# Patient Record
Sex: Female | Born: 1980 | Race: Black or African American | Hispanic: No | Marital: Single | Smoking: Never smoker
Health system: Southern US, Community
[De-identification: ages and names within clinical notes are randomized; demographics above are authoritative.]

---
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
## 2003-03-15 ENCOUNTER — Encounter: Payer: Self-pay | Admitting: Emergency Medicine

## 2003-03-15 ENCOUNTER — Emergency Department (HOSPITAL_COMMUNITY): Admission: EM | Admit: 2003-03-15 | Discharge: 2003-03-15 | Payer: Self-pay | Admitting: Emergency Medicine

## 2003-05-18 ENCOUNTER — Emergency Department (HOSPITAL_COMMUNITY): Admission: EM | Admit: 2003-05-18 | Discharge: 2003-05-18 | Payer: Self-pay | Admitting: Emergency Medicine

## 2003-05-31 ENCOUNTER — Emergency Department (HOSPITAL_COMMUNITY): Admission: EM | Admit: 2003-05-31 | Discharge: 2003-05-31 | Payer: Self-pay | Admitting: Emergency Medicine

## 2004-05-14 ENCOUNTER — Emergency Department (HOSPITAL_COMMUNITY): Admission: EM | Admit: 2004-05-14 | Discharge: 2004-05-14 | Payer: Self-pay | Admitting: Emergency Medicine

## 2004-10-05 ENCOUNTER — Emergency Department (HOSPITAL_COMMUNITY): Admission: EM | Admit: 2004-10-05 | Discharge: 2004-10-05 | Payer: Self-pay | Admitting: Emergency Medicine

## 2013-07-15 ENCOUNTER — Emergency Department (HOSPITAL_COMMUNITY): Payer: No Typology Code available for payment source

## 2013-07-15 ENCOUNTER — Emergency Department (HOSPITAL_COMMUNITY): Payer: Self-pay

## 2013-07-15 ENCOUNTER — Emergency Department (HOSPITAL_COMMUNITY)
Admission: EM | Admit: 2013-07-15 | Discharge: 2013-07-15 | Disposition: A | Discharge status: Home / Self Care | Payer: Self-pay | Attending: Emergency Medicine | Admitting: Emergency Medicine

## 2013-07-15 ENCOUNTER — Other Ambulatory Visit: Payer: Self-pay

## 2013-07-15 ENCOUNTER — Encounter (HOSPITAL_COMMUNITY): Payer: Self-pay | Admitting: Emergency Medicine

## 2013-07-15 DIAGNOSIS — S3981XA Other specified injuries of abdomen, initial encounter: Secondary | ICD-10-CM | POA: Insufficient documentation

## 2013-07-15 DIAGNOSIS — K146 Glossodynia: Secondary | ICD-10-CM | POA: Insufficient documentation

## 2013-07-15 DIAGNOSIS — Y9389 Activity, other specified: Secondary | ICD-10-CM | POA: Insufficient documentation

## 2013-07-15 DIAGNOSIS — Y92410 Unspecified street and highway as the place of occurrence of the external cause: Secondary | ICD-10-CM | POA: Insufficient documentation

## 2013-07-15 DIAGNOSIS — IMO0002 Reserved for concepts with insufficient information to code with codable children: Secondary | ICD-10-CM | POA: Insufficient documentation

## 2013-07-15 DIAGNOSIS — S0993XA Unspecified injury of face, initial encounter: Secondary | ICD-10-CM | POA: Insufficient documentation

## 2013-07-15 DIAGNOSIS — Z79899 Other long term (current) drug therapy: Secondary | ICD-10-CM | POA: Insufficient documentation

## 2013-07-15 DIAGNOSIS — Z3202 Encounter for pregnancy test, result negative: Secondary | ICD-10-CM | POA: Insufficient documentation

## 2013-07-15 LAB — POCT I-STAT, CHEM 8
BUN: 13 mg/dL (ref 6–23)
Chloride: 101 mEq/L (ref 96–112)
Creatinine, Ser: 0.9 mg/dL (ref 0.50–1.10)
Potassium: 3.7 mEq/L (ref 3.5–5.1)
Sodium: 140 mEq/L (ref 135–145)

## 2013-07-15 LAB — CBC WITH DIFFERENTIAL/PLATELET
Basophils Relative: 0 % (ref 0–1)
Eosinophils Absolute: 0.2 10*3/uL (ref 0.0–0.7)
Hemoglobin: 14.5 g/dL (ref 12.0–15.0)
MCH: 33.9 pg (ref 26.0–34.0)
MCHC: 35.9 g/dL (ref 30.0–36.0)
Monocytes Relative: 8 % (ref 3–12)
Neutrophils Relative %: 58 % (ref 43–77)
Platelets: 229 10*3/uL (ref 150–400)
WBC: 7.7 10*3/uL (ref 4.0–10.5)

## 2013-07-15 LAB — COMPREHENSIVE METABOLIC PANEL
Albumin: 3.8 g/dL (ref 3.5–5.2)
Alkaline Phosphatase: 40 U/L (ref 39–117)
BUN: 13 mg/dL (ref 6–23)
CO2: 25 mEq/L (ref 19–32)
Creatinine, Ser: 0.68 mg/dL (ref 0.50–1.10)
GFR calc non Af Amer: >90 mL/min (ref >90)
Glucose, Bld: 86 mg/dL (ref 70–99)
Potassium: 3.7 mEq/L (ref 3.5–5.1)
Total Protein: 7.8 g/dL (ref 6.0–8.3)

## 2013-07-15 MED ORDER — ONDANSETRON HCL 4 MG/2ML IJ SOLN
4.0000 mg | Freq: Once | INTRAMUSCULAR | Status: AC
  Administered 2013-07-15: 4 mg via INTRAVENOUS

## 2013-07-15 MED ORDER — HYDROCODONE-ACETAMINOPHEN 5-325 MG PO TABS: 1.0000 | ORAL_TABLET | ORAL | Status: AC | PRN

## 2013-07-15 MED ORDER — FENTANYL CITRATE 0.05 MG/ML IJ SOLN
50.0000 ug | Freq: Once | INTRAMUSCULAR | Status: AC
  Administered 2013-07-15: 50 ug via INTRAVENOUS

## 2013-07-15 MED ORDER — ORPHENADRINE CITRATE ER 100 MG PO TB12: 100.0000 mg | ORAL_TABLET | Freq: Two times a day (BID) | ORAL | Status: AC | PRN

## 2013-07-15 MED ORDER — IBUPROFEN 600 MG PO TABS: 600.0000 mg | ORAL_TABLET | Freq: Four times a day (QID) | ORAL | Status: AC | PRN

## 2013-07-15 MED ORDER — ONDANSETRON HCL 4 MG PO TABS: 4.0000 mg | ORAL_TABLET | Freq: Three times a day (TID) | ORAL | Status: AC | PRN

## 2013-07-15 MED ORDER — FENTANYL CITRATE 0.05 MG/ML IJ SOLN
INTRAMUSCULAR | Status: AC
  Filled 2013-07-15: qty 2

## 2013-07-15 MED ORDER — SODIUM CHLORIDE 0.9 % IV BOLUS (SEPSIS)
1000.0000 mL | Freq: Once | INTRAVENOUS | Status: AC
  Administered 2013-07-15: 1000 mL via INTRAVENOUS

## 2013-07-15 MED ORDER — IOHEXOL 300 MG/ML  SOLN
100.0000 mL | Freq: Once | INTRAMUSCULAR | Status: AC | PRN
  Administered 2013-07-15: 100 mL via INTRAVENOUS

## 2013-07-15 MED ORDER — ONDANSETRON HCL 4 MG/2ML IJ SOLN
INTRAMUSCULAR | Status: AC
  Filled 2013-07-15: qty 2

## 2013-07-15 NOTE — ED Notes (Signed)
Patient transported to CT 

## 2013-07-15 NOTE — ED Notes (Signed)
Pt requesting that mom Yancey Flemings) be called and told that pt was in the ER and for mom to come and see her. RN called mom and informed her of such.

## 2013-07-15 NOTE — ED Notes (Signed)
Pt placed on bedpan at this time.

## 2013-07-15 NOTE — ED Notes (Signed)
Husband at the bedside. 
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
## 2013-07-15 NOTE — ED Notes (Signed)
Pt and husband verbally arguing at this time.
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
## 2013-07-15 NOTE — ED Notes (Signed)
CT tech informed RN that pt was highly agitated, sitting straight up on stretcher, twisting body right and left demanding her ID back from her mother. CT tech stated that pt never once complained of pain when moving around on the stretcher.

## 2013-07-15 NOTE — ED Notes (Signed)
Pt undressed, in gown, on monitor, continuous pulse oximetry and blood pressure cuff; warm blankets given 
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
